# Patient Record
Sex: Male | Born: 1965 | Smoking: Never smoker
Health system: Southern US, Community
[De-identification: ages and names within clinical notes are randomized; demographics above are authoritative.]

## PROBLEM LIST (undated history)

## (undated) DIAGNOSIS — R7989 Other specified abnormal findings of blood chemistry: Secondary | ICD-10-CM

## (undated) DIAGNOSIS — G8929 Other chronic pain: Secondary | ICD-10-CM

---

## 2014-05-03 ENCOUNTER — Emergency Department: Payer: Self-pay | Admitting: Emergency Medicine

## 2014-05-05 ENCOUNTER — Emergency Department: Payer: Self-pay | Admitting: Internal Medicine

## 2014-05-11 ENCOUNTER — Emergency Department: Payer: Self-pay | Admitting: Emergency Medicine

## 2015-04-21 ENCOUNTER — Other Ambulatory Visit: Payer: Self-pay | Admitting: Internal Medicine

## 2015-04-21 DIAGNOSIS — R519 Headache, unspecified: Secondary | ICD-10-CM

## 2015-04-21 DIAGNOSIS — R51 Headache: Principal | ICD-10-CM

## 2015-05-02 ENCOUNTER — Ambulatory Visit: Admission: RE | Admit: 2015-05-02 | Payer: Self-pay | Source: Ambulatory Visit

## 2016-07-04 IMAGING — CT CT HEAD WITHOUT CONTRAST
3 of 5 series · 12 of 47 positions shown, 14 images · non-contrast
Comparison: None.

CLINICAL DATA: Unsteady gait. Patient fell at work prior to
arrival, striking head on the floor. Head and neck pain. Loss of
consciousness.

EXAM:
CT HEAD WITHOUT CONTRAST
CT CERVICAL SPINE WITHOUT CONTRAST
TECHNIQUE: Multidetector CT imaging of the head and cervical spine was
performed following the standard protocol without intravenous
contrast. Multiplanar CT image reconstructions of the cervical spine
were also generated.

[Series 6: sag bone · sagittal · 0.26mm/px · 5 of 41 slices shown, 6 images]
[im 7/41  bone]
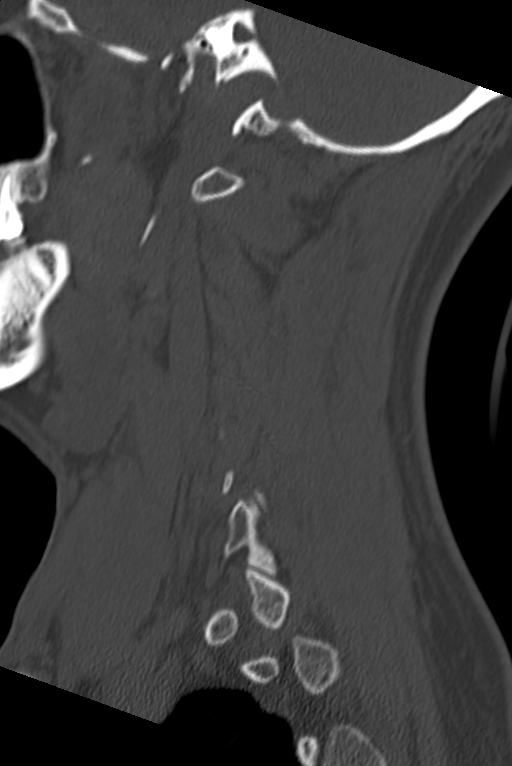
[im 14/41  bone]
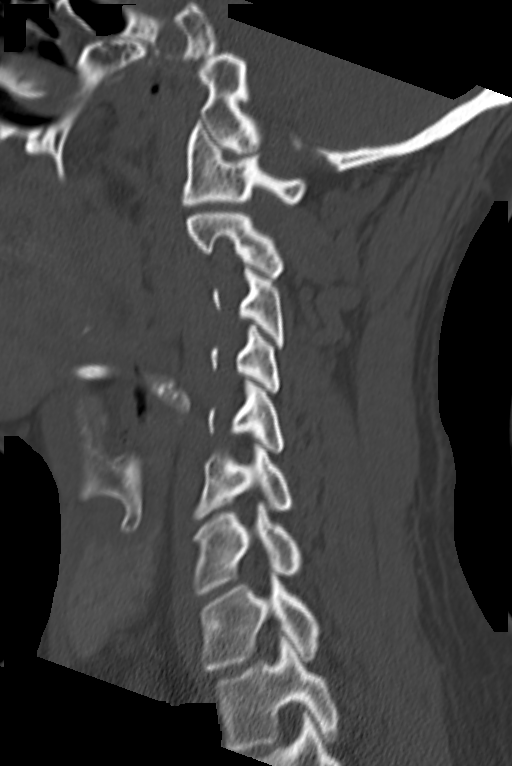
[im 21/41  brain]
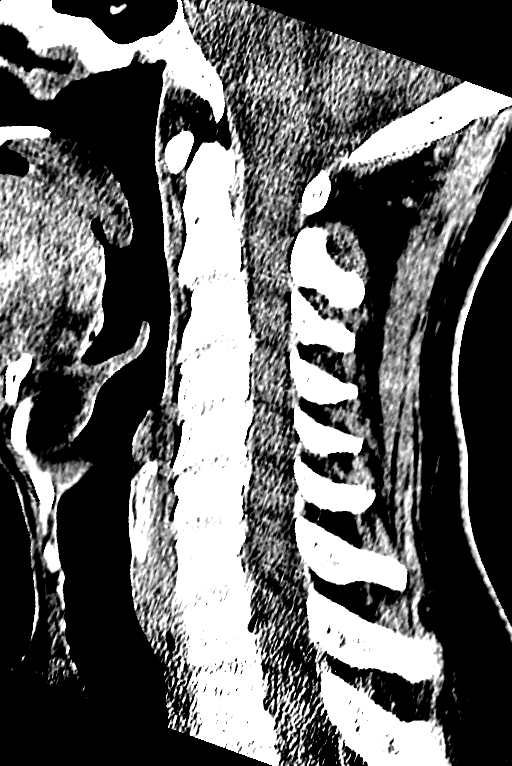
[im 21/41  bone]
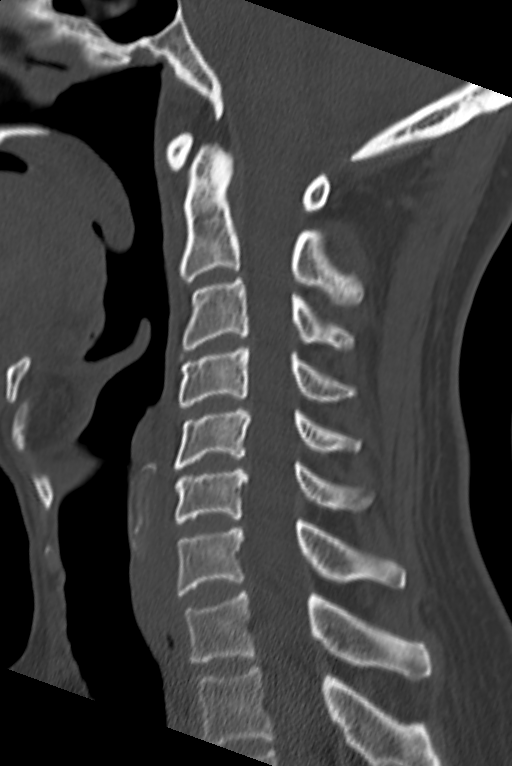
[im 27/41  bone]
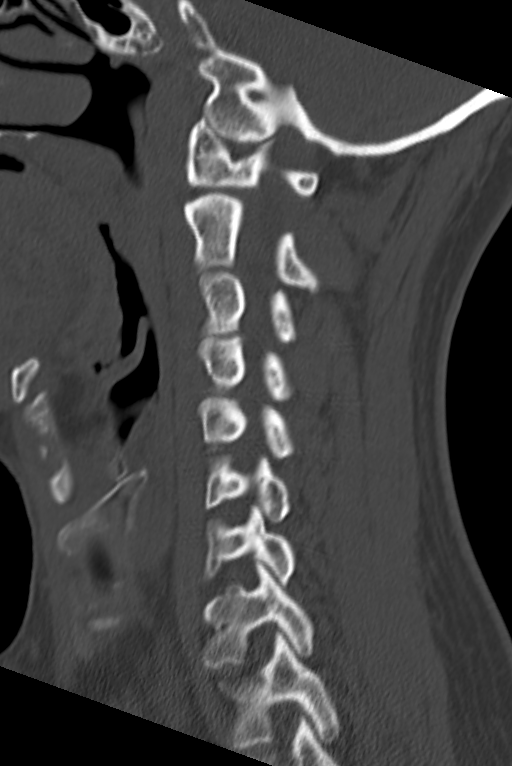
[im 34/41  bone]
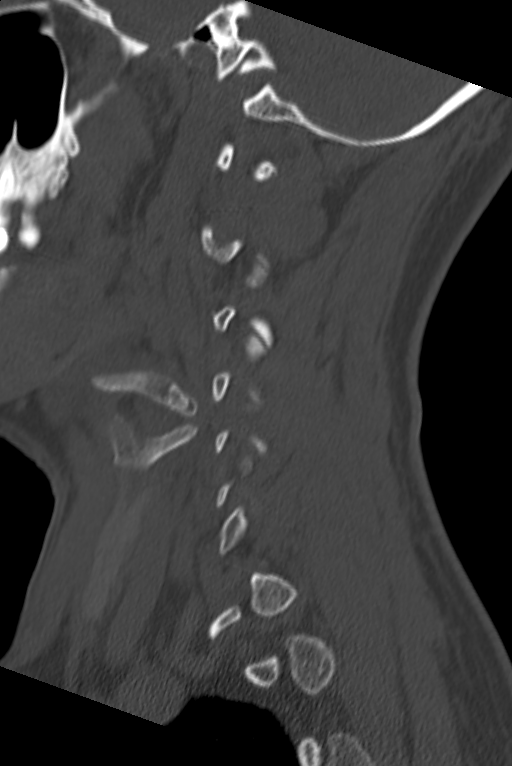

[Series 7: cor bone · coronal · 0.27mm/px · 3 of 42 slices shown]
[im 14/42  bone]
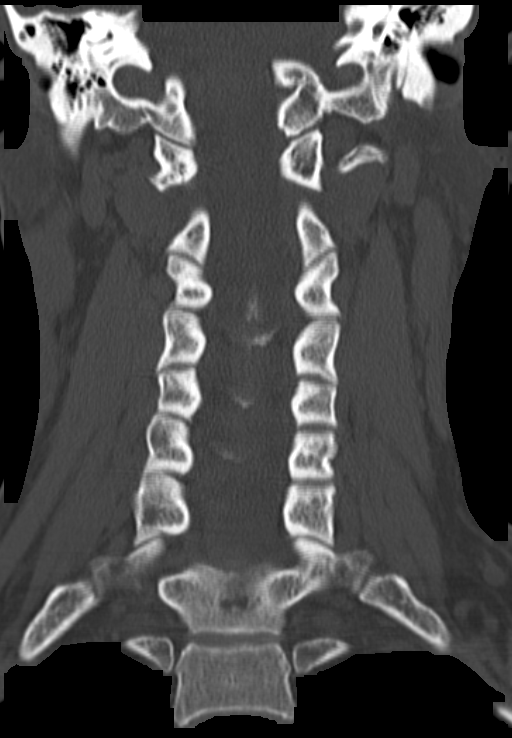
[im 19/42  bone]
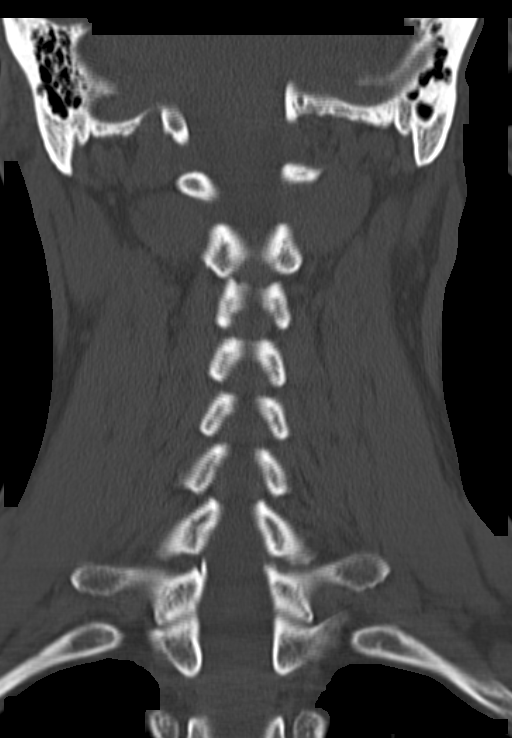
[im 23/42  bone]
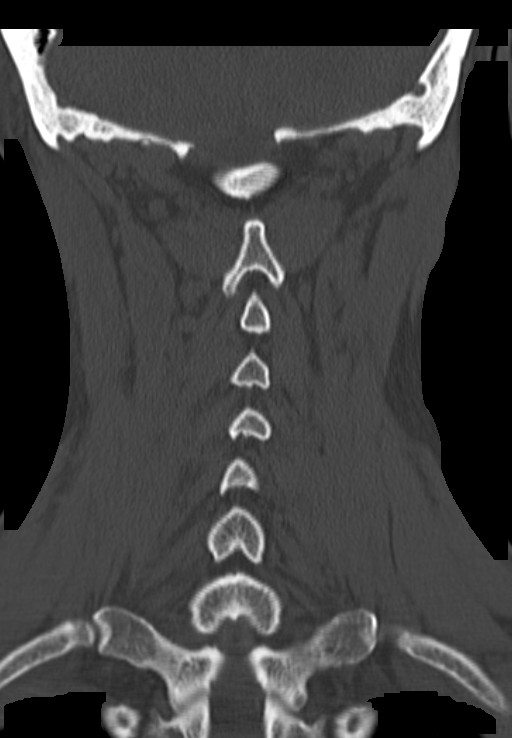

[Series 8: orthogonal axials · axial · 0.29mm/px · z∈[-294,-178]mm · 4 of 94 slices shown, 5 images]
[im 16/94  brain]
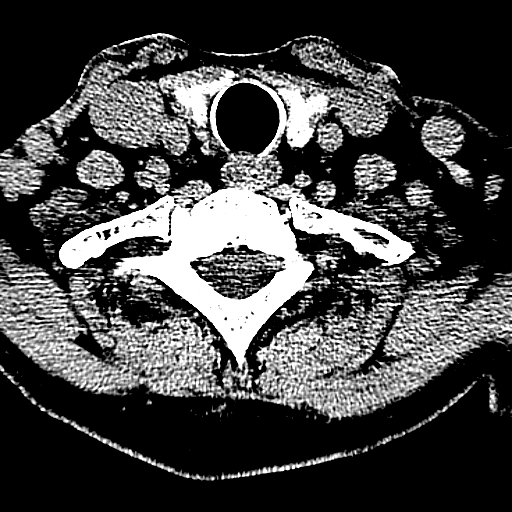
[im 16/94  bone]
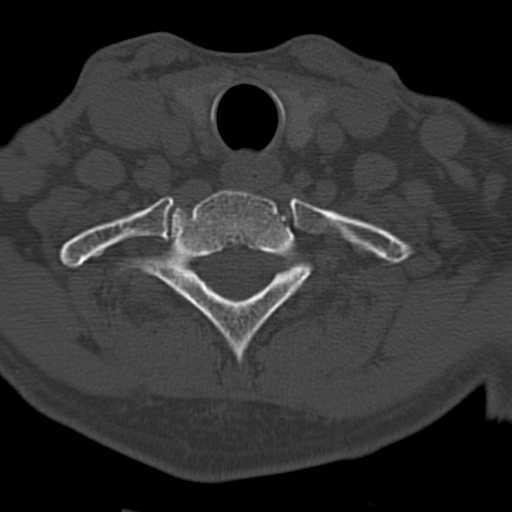
[im 39/94  bone]
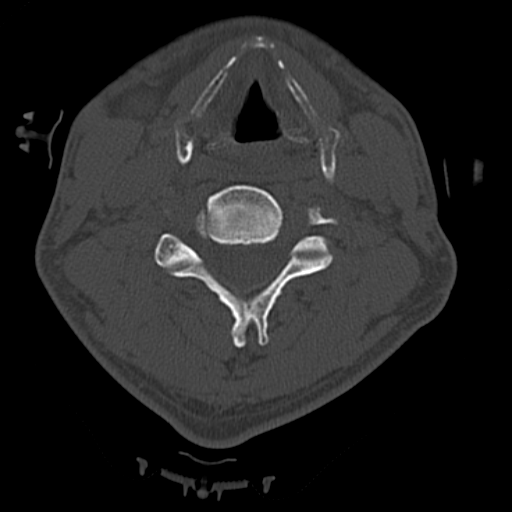
[im 55/94  bone]
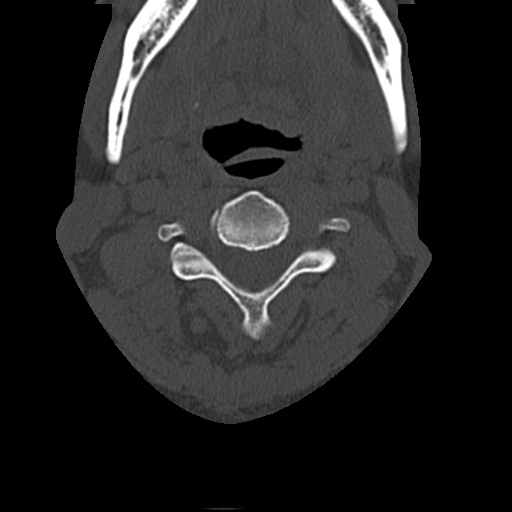
[im 78/94  bone]
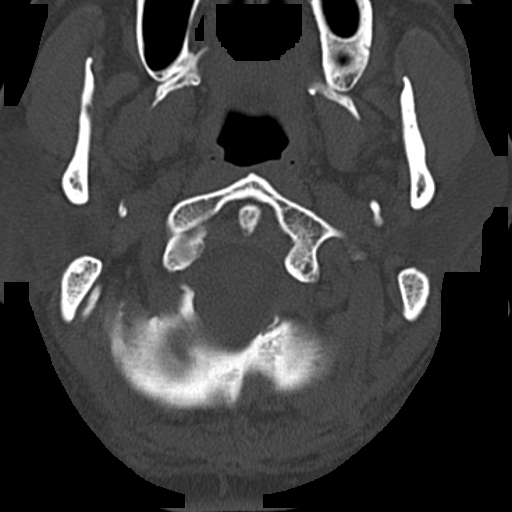

[12 of 47 positions shown; findings below may reference images not displayed]

FINDINGS: CT HEAD FINDINGS

Subcutaneous scalp hematoma and hemorrhage along the right posterior
parietal region. Ventricles and sulci appear symmetrical. No mass
effect or midline shift. No abnormal extra-axial fluid collections.
Gray-white matter junctions are distinct. Basal cisterns are not
effaced. No evidence of acute intracranial hemorrhage. No depressed
skull fractures. Mild mucosal thickening in the paranasal sinuses.
Mastoid air cells are not opacified.

CT CERVICAL SPINE FINDINGS

Reversal of the usual cervical lordosis. This may be due to patient
positioning but ligamentous injury or muscle spasm are not excluded.
No anterior subluxation. Normal alignment of facet joints. C1-2
articulation appears intact. No vertebral compression deformities.
Mild degenerative changes with mild disc space narrowing and
hypertrophic changes at the endplates predominantly at C3-4, C4-5,
and C5-6 levels. No prevertebral soft tissue swelling. No focal bone
lesion or bone destruction. Bone cortex and trabecular architecture
appears intact. Soft tissues are unremarkable.
IMPRESSION: No acute intracranial abnormalities.

Nonspecific reversal of the usual cervical lordosis. Mild
degenerative changes. No displaced fractures identified.

## 2018-10-05 ENCOUNTER — Ambulatory Visit: Payer: Self-pay | Admitting: Physician Assistant

## 2020-11-13 ENCOUNTER — Encounter: Admission: RE | Payer: Self-pay | Source: Home / Self Care

## 2020-11-13 ENCOUNTER — Encounter: Payer: Self-pay | Admitting: Anesthesiology

## 2020-11-13 ENCOUNTER — Ambulatory Visit: Admission: RE | Admit: 2020-11-13 | Payer: 59 | Source: Home / Self Care

## 2020-11-13 HISTORY — DX: Other chronic pain: G89.29

## 2020-11-13 HISTORY — DX: Other specified abnormal findings of blood chemistry: R79.89

## 2020-11-13 SURGERY — COLONOSCOPY WITH PROPOFOL
Anesthesia: General

## 2021-03-08 ENCOUNTER — Encounter: Payer: Self-pay | Admitting: Emergency Medicine

## 2021-03-09 ENCOUNTER — Encounter: Admission: RE | Payer: Self-pay | Source: Home / Self Care

## 2021-03-09 ENCOUNTER — Ambulatory Visit: Admission: RE | Admit: 2021-03-09 | Payer: 59 | Source: Home / Self Care

## 2021-03-09 SURGERY — COLONOSCOPY WITH PROPOFOL
Anesthesia: General

## 2021-10-19 ENCOUNTER — Other Ambulatory Visit (INDEPENDENT_AMBULATORY_CARE_PROVIDER_SITE_OTHER): Payer: Self-pay | Admitting: Nurse Practitioner

## 2021-10-19 DIAGNOSIS — I83812 Varicose veins of left lower extremities with pain: Secondary | ICD-10-CM

## 2021-10-22 ENCOUNTER — Encounter (INDEPENDENT_AMBULATORY_CARE_PROVIDER_SITE_OTHER): Payer: Self-pay | Admitting: Vascular Surgery

## 2021-10-22 ENCOUNTER — Ambulatory Visit (INDEPENDENT_AMBULATORY_CARE_PROVIDER_SITE_OTHER): Payer: Self-pay | Admitting: Vascular Surgery

## 2021-10-22 ENCOUNTER — Ambulatory Visit (INDEPENDENT_AMBULATORY_CARE_PROVIDER_SITE_OTHER): Payer: Self-pay

## 2021-10-22 DIAGNOSIS — I83819 Varicose veins of unspecified lower extremities with pain: Secondary | ICD-10-CM | POA: Insufficient documentation

## 2021-10-22 DIAGNOSIS — I83812 Varicose veins of left lower extremities with pain: Secondary | ICD-10-CM

## 2021-10-22 DIAGNOSIS — I872 Venous insufficiency (chronic) (peripheral): Secondary | ICD-10-CM

## 2021-10-22 NOTE — Progress Notes (Signed)
? ? ? ? ?MRN : [id number] ? ?Sean Beasley is a 56 y.o. (03-23-1966) male who presents with chief complaint of varicose veins hurt. ? ?History of Present Illness: The patient is seen for evaluation of symptomatic varicose veins. The patient relates burning and stinging which worsened steadily throughout the course of the day, particularly with standing. The patient also notes an aching and throbbing pain over the varicosities, particularly with prolonged dependent positions. The symptoms are significantly improved with elevation.  The patient also notes that during hot weather the symptoms are greatly intensified. The patient states the pain from the varicose veins interferes with work, daily exercise, shopping and household maintenance. At this point, the symptoms are persistent and severe enough that they're having a negative impact on lifestyle and are interfering with daily activities. ? ?There is no history of DVT, PE or superficial thrombophlebitis. ?There is no history of ulceration or hemorrhage. ?The patient denies a significant family history of varicose veins. ? ?The patient has not worn graduated compression in the past. At the present time the patient has not been using over-the-counter analgesics. There is no history of prior surgical intervention or sclerotherapy. ?  ?Venous duplex of the left lower extremity shows normal deep venous system, no reflux in the saphenous veins.  Isolated reflux in venous tributaries in the medial ankle. ? ?No outpatient medications have been marked as taking for the 10/22/21 encounter (Appointment) with Gilda Crease, Latina Craver, MD.  ? ? ?Past Medical History:  ?Diagnosis Date  ? Chronic left shoulder pain   ? Low vitamin D level   ? ? ?No past surgical history on file. ? ?Social History ?  ? ?Family History ?No family history on file. ? ?Not on File ? ? ?REVIEW OF SYSTEMS (Negative unless checked) ? ?Constitutional: [] Weight loss  [] Fever  [] Chills ?Cardiac: [] Chest pain   [] Chest  pressure   [] Palpitations   [] Shortness of breath when laying flat   [] Shortness of breath with exertion. ?Vascular:  [] Pain in legs with walking   [x] Pain in legs with standing  [] History of DVT   [] Phlebitis   [] Swelling in legs   [x] Varicose veins   [] Non-healing ulcers ?Pulmonary:   [] Uses home oxygen   [] Productive cough   [] Hemoptysis   [] Wheeze  [] COPD   [] Asthma ?Neurologic:  [] Dizziness   [] Seizures   [] History of stroke   [] History of TIA  [] Aphasia   [] Vissual changes   [] Weakness or numbness in arm   [] Weakness or numbness in leg ?Musculoskeletal:   [] Joint swelling   [] Joint pain   [] Low back pain ?Hematologic:  [] Easy bruising  [] Easy bleeding   [] Hypercoagulable state   [] Anemic ?Gastrointestinal:  [] Diarrhea   [] Vomiting  [] Gastroesophageal reflux/heartburn   [] Difficulty swallowing. ?Genitourinary:  [] Chronic kidney disease   [] Difficult urination  [] Frequent urination   [] Blood in urine ?Skin:  [] Rashes   [] Ulcers  ?Psychological:  [] History of anxiety   []  History of major depression. ? ?Physical Examination ? ?There were no vitals filed for this visit. ?There is no height or weight on file to calculate BMI. ?Gen: WD/WN, NAD ?Head: /AT, No temporalis wasting.  ?Ear/Nose/Throat: Hearing grossly intact, nares w/o erythema or drainage, pinna without lesions ?Eyes: PER, EOMI, sclera nonicteric.  ?Neck: Supple, no gross masses.  No JVD.  ?Pulmonary:  Good air movement, no audible wheezing, no use of accessory muscles.  ?Cardiac: RRR, precordium not hyperdynamic. ?Vascular:  Large varicosities present, greater than 6 mm left ankle.  Veins  are tender to palpation  Moderate venous stasis changes to the legs bilaterally.  Trace soft pitting edema  ?Vessel Right Left  ?Radial Palpable Palpable  ?Gastrointestinal: soft, non-distended. No guarding/no peritoneal signs.  ?Musculoskeletal: M/S 5/5 throughout.  No deformity.  ?Neurologic: CN 2-12 intact. Pain and light touch intact in extremities.  Symmetrical.   Speech is fluent. Motor exam as listed above. ?Psychiatric: Judgment intact, Mood & affect appropriate for pt's clinical situation. ?Dermatologic: Venous rashes no ulcers noted.  No changes consistent with cellulitis. ?Lymph : No lichenification or skin changes of chronic lymphedema. ? ?CBC ?No results found for: WBC, HGB, HCT, MCV, PLT ? ?BMET ?No results found for: NA, K, CL, CO2, GLUCOSE, BUN, CREATININE, CALCIUM, GFRNONAA, GFRAA ?CrCl cannot be calculated (No successful lab value found.). ? ?COAG ?No results found for: INR, PROTIME ? ?Radiology ?No results found. ? ? ?Assessment/Plan ?1. Varicose veins with pain ?Recommend: ? ?The patient is complaining of varicose veins.   ? ?I have had a long discussion with the patient regarding  varicose veins and why they cause symptoms.  Patient will begin wearing graduated compression stockings on a daily basis, beginning first thing in the morning and removing them in the evening. The patient is instructed specifically not to sleep in the stockings.   ? ?The patient  will also begin using over-the-counter analgesics such as Motrin 600 mg po TID to help control the symptoms as needed.   ? ?In addition, behavioral modification including elevation during the day will be initiated, utilizing a recliner was recommended.  The patient is also instructed to continue exercising such as walking 4-5 times per week. ? ?At this time the patient wishes to continue conservative therapy and is not interested in more invasive treatments such as laser ablation and sclerotherapy. ? ?The Patient will follow up PRN if the symptoms worsen.  ? ?2. Chronic venous insufficiency ?Recommend: ? ?The patient is complaining of varicose veins.   ? ?I have had a long discussion with the patient regarding  varicose veins and why they cause symptoms.  Patient will begin wearing graduated compression stockings on a daily basis, beginning first thing in the morning and removing them in the evening. The  patient is instructed specifically not to sleep in the stockings.   ? ?The patient  will also begin using over-the-counter analgesics such as Motrin 600 mg po TID to help control the symptoms as needed.   ? ?In addition, behavioral modification including elevation during the day will be initiated, utilizing a recliner was recommended.  The patient is also instructed to continue exercising such as walking 4-5 times per week. ? ?At this time the patient wishes to continue conservative therapy and is not interested in more invasive treatments such as laser ablation and sclerotherapy. ? ?The Patient will follow up PRN if the symptoms worsen.  ? ?3. Varicose veins of left lower extremity with pain ?Recommend: ? ?The patient is complaining of varicose veins.   ? ?I have had a long discussion with the patient regarding  varicose veins and why they cause symptoms.  Patient will begin wearing graduated compression stockings on a daily basis, beginning first thing in the morning and removing them in the evening. The patient is instructed specifically not to sleep in the stockings.   ? ?The patient  will also begin using over-the-counter analgesics such as Motrin 600 mg po TID to help control the symptoms as needed.   ? ?In addition, behavioral modification including elevation  during the day will be initiated, utilizing a recliner was recommended.  The patient is also instructed to continue exercising such as walking 4-5 times per week. ? ?At this time the patient wishes to continue conservative therapy and is not interested in more invasive treatments such as laser ablation and sclerotherapy. ? ?The Patient will follow up PRN if the symptoms worsen.  ?- VAS US LOWER EXTREMITY VENOUS REFLUX ? ? ? ?Sean DredgeGregory Harvest Stanco, MD ? ?10/22/2021 ?12:39 PM ? ?  ?

## 2021-12-25 ENCOUNTER — Encounter: Payer: Self-pay | Admitting: Ophthalmology

## 2021-12-27 NOTE — Discharge Instructions (Signed)

## 2022-01-02 ENCOUNTER — Other Ambulatory Visit: Payer: Self-pay

## 2022-01-02 ENCOUNTER — Ambulatory Visit: Payer: 59 | Admitting: Anesthesiology

## 2022-01-02 ENCOUNTER — Encounter
Admission: RE | Disposition: A | Discharge status: Home / Self Care | Payer: Self-pay | Source: Ambulatory Visit | Attending: Ophthalmology

## 2022-01-02 ENCOUNTER — Ambulatory Visit
Admission: RE | Admit: 2022-01-02 | Discharge: 2022-01-02 | Disposition: A | Discharge status: Home / Self Care | Payer: 59 | Source: Ambulatory Visit | Attending: Ophthalmology | Admitting: Ophthalmology

## 2022-01-02 ENCOUNTER — Encounter: Payer: Self-pay | Admitting: Ophthalmology

## 2022-01-02 DIAGNOSIS — H2512 Age-related nuclear cataract, left eye: Secondary | ICD-10-CM | POA: Diagnosis present

## 2022-01-02 HISTORY — PX: CATARACT EXTRACTION W/PHACO: SHX586

## 2022-01-02 SURGERY — PHACOEMULSIFICATION, CATARACT, WITH IOL INSERTION
Anesthesia: Monitor Anesthesia Care | Site: Eye | Laterality: Left

## 2022-01-02 MED ORDER — SIGHTPATH DOSE#1 NA HYALUR & NA CHOND-NA HYALUR IO KIT
PACK | INTRAOCULAR | Status: DC | PRN
  Administered 2022-01-02: 1 via OPHTHALMIC

## 2022-01-02 MED ORDER — MIDAZOLAM HCL 2 MG/2ML IJ SOLN
INTRAMUSCULAR | Status: DC | PRN
  Administered 2022-01-02: 1 mg via INTRAVENOUS

## 2022-01-02 MED ORDER — LIDOCAINE HCL (PF) 2 % IJ SOLN
INTRAOCULAR | Status: DC | PRN
  Administered 2022-01-02: 1 mL via INTRAOCULAR

## 2022-01-02 MED ORDER — SIGHTPATH DOSE#1 BSS IO SOLN: INTRAOCULAR | Status: DC | PRN

## 2022-01-02 MED ORDER — FENTANYL CITRATE (PF) 100 MCG/2ML IJ SOLN
INTRAMUSCULAR | Status: DC | PRN
  Administered 2022-01-02 (×2): 25 ug via INTRAVENOUS

## 2022-01-02 MED ORDER — TETRACAINE HCL 0.5 % OP SOLN
1.0000 [drp] | OPHTHALMIC | Status: DC | PRN
  Administered 2022-01-02 (×3): 1 [drp] via OPHTHALMIC

## 2022-01-02 MED ORDER — MOXIFLOXACIN HCL 0.5 % OP SOLN
OPHTHALMIC | Status: DC | PRN
  Administered 2022-01-02: 0.2 mL via OPHTHALMIC

## 2022-01-02 MED ORDER — ACETAMINOPHEN 325 MG PO TABS: 325.0000 mg | ORAL_TABLET | ORAL | Status: DC | PRN

## 2022-01-02 MED ORDER — SIGHTPATH DOSE#1 BSS IO SOLN
INTRAOCULAR | Status: DC | PRN
  Administered 2022-01-02: 15 mL

## 2022-01-02 MED ORDER — ARMC OPHTHALMIC DILATING DROPS
1.0000 | OPHTHALMIC | Status: DC | PRN
  Administered 2022-01-02 (×3): 1 via OPHTHALMIC

## 2022-01-02 MED ORDER — PHENYLEPHRINE-KETOROLAC 1-0.3 % IO SOLN
INTRAOCULAR | Status: DC | PRN
  Administered 2022-01-02: 69 mL via OPHTHALMIC

## 2022-01-02 MED ORDER — BRIMONIDINE TARTRATE-TIMOLOL 0.2-0.5 % OP SOLN
OPHTHALMIC | Status: DC | PRN
  Administered 2022-01-02: 1 [drp] via OPHTHALMIC

## 2022-01-02 MED ORDER — ACETAMINOPHEN 160 MG/5ML PO SOLN: 325.0000 mg | ORAL | Status: DC | PRN

## 2022-01-02 MED ORDER — ONDANSETRON HCL 4 MG/2ML IJ SOLN: 4.0000 mg | Freq: Once | INTRAMUSCULAR | Status: DC | PRN

## 2022-01-02 SURGICAL SUPPLY — 16 items
CATARACT SUITE SIGHTPATH (MISCELLANEOUS) ×2 IMPLANT
DISSECTOR HYDRO NUCLEUS 50X22 (MISCELLANEOUS) ×2 IMPLANT
DRSG TEGADERM 2-3/8X2-3/4 SM (GAUZE/BANDAGES/DRESSINGS) ×2 IMPLANT
FEE CATARACT SUITE SIGHTPATH (MISCELLANEOUS) ×1 IMPLANT
GLOVE SURG GAMMEX PI TX LF 7.5 (GLOVE) ×2 IMPLANT
GLOVE SURG SYN 8.5  E (GLOVE) ×1
GLOVE SURG SYN 8.5 E (GLOVE) ×1 IMPLANT
GLOVE SURG SYN 8.5 PF PI (GLOVE) ×1 IMPLANT
LENS IOL TECNIS EYHANCE 20.5 (Intraocular Lens) ×1 IMPLANT
NDL FILTER BLUNT 18X1 1/2 (NEEDLE) ×1 IMPLANT
NEEDLE FILTER BLUNT 18X 1/2SAF (NEEDLE) ×1
NEEDLE FILTER BLUNT 18X1 1/2 (NEEDLE) ×1 IMPLANT
SYR 3ML LL SCALE MARK (SYRINGE) ×2 IMPLANT
SYR 5ML LL (SYRINGE) ×2 IMPLANT
TIP I/A CVD TIP (MISCELLANEOUS) ×1 IMPLANT
WATER STERILE IRR 250ML POUR (IV SOLUTION) ×2 IMPLANT

## 2022-01-02 NOTE — Anesthesia Postprocedure Evaluation (Signed)
Anesthesia Post Note  Patient: Sean Beasley  Procedure(s) Performed: CATARACT EXTRACTION PHACO AND INTRAOCULAR LENS PLACEMENT (IOC) LEFT VISION BLUE (Left: Eye)     Patient location during evaluation: PACU Anesthesia Type: MAC Level of consciousness: awake Pain management: pain level controlled Vital Signs Assessment: post-procedure vital signs reviewed and stable Respiratory status: respiratory function stable Cardiovascular status: stable Postop Assessment: no apparent nausea or vomiting Anesthetic complications: no   No notable events documented.  Veda Canning

## 2022-01-02 NOTE — Op Note (Signed)
OPERATIVE NOTE  Sean Beasley [id number] 01/02/2022   PREOPERATIVE DIAGNOSIS: Nuclear sclerotic cataract left eye. H25.12   POSTOPERATIVE DIAGNOSIS: Nuclear sclerotic cataract left eye. H25.12   PROCEDURE:  Phacoemusification with posterior chamber intraocular lens placement of the left eye  Ultrasound time: Procedure(s) with comments: CATARACT EXTRACTION PHACO AND INTRAOCULAR LENS PLACEMENT (IOC) LEFT VISION BLUE (Left) - 3.93 00:34.0  LENS:   Implant Name Type Inv. Item Serial No. Manufacturer Lot No. LRB No. Used Action  LENS IOL TECNIS EYHANCE 20.5 - [id number] Intraocular Lens LENS IOL TECNIS EYHANCE 20.5 [id number] [employer]  Left 1 Implanted      SURGEON:  Julious Payer. Rolley Sims, MD   ANESTHESIA:  Topical with tetracaine drops, augmented with 1% preservative-free intracameral lidocaine.   COMPLICATIONS:  None.   DESCRIPTION OF PROCEDURE:  The patient was identified in the holding room and transported to the operating room and placed in the supine position under the operating microscope.  The left eye was identified as the operative eye, which was prepped and draped in the usual sterile ophthalmic fashion.   A 1 millimeter clear-corneal paracentesis was made inferotemporally. Preservative-free 1% lidocaine mixed with 1:1,000 bisulfite-free aqueous solution of epinephrine was injected into the anterior chamber. The anterior chamber was then filled with Viscoat viscoelastic. A 2.4 millimeter keratome was used to make a clear-corneal incision superotemporally. A curvilinear capsulorrhexis was made with a cystotome and capsulorrhexis forceps. Balanced salt solution was used to hydrodissect and hydrodelineate the nucleus. Phacoemulsification was then used to remove the lens nucleus and epinucleus. The remaining cortex was then removed using the irrigation and aspiration handpiece. Provisc was then placed into the capsular bag to distend it for lens placement. A +20.50 DIB00 intraocular lens was  then injected into the capsular bag. The remaining viscoelastic was aspirated.   Wounds were hydrated with balanced salt solution.  The anterior chamber was inflated to a physiologic pressure with balanced salt solution.  No wound leaks were noted. Vigamox was injected intracamerally.  Timolol and Brimonidine drops were applied to the eye.  The patient was taken to the recovery room in stable condition without complications of anesthesia or surgery.  [name] 01/02/2022, 9:35 AM

## 2022-01-02 NOTE — Transfer of Care (Signed)
Immediate Anesthesia Transfer of Care Note  Patient: Sean Beasley  Procedure(s) Performed: CATARACT EXTRACTION PHACO AND INTRAOCULAR LENS PLACEMENT (IOC) LEFT VISION BLUE (Left: Eye)  Patient Location: PACU  Anesthesia Type: MAC  Level of Consciousness: awake, alert  and patient cooperative  Airway and Oxygen Therapy: Patient Spontanous Breathing and Patient connected to supplemental oxygen  Post-op Assessment: Post-op Vital signs reviewed, Patient's Cardiovascular Status Stable, Respiratory Function Stable, Patent Airway and No signs of Nausea or vomiting  Post-op Vital Signs: Reviewed and stable  Complications: No notable events documented.

## 2022-01-02 NOTE — H&P (Signed)
[facility]   Primary Care Physician:  Barbette Reichmann, MD Ophthalmologist: Dr. Deberah Pelton  Pre-Procedure History & Physical: HPI:  Sean Beasley is a 56 y.o. male here for cataract surgery.   Past Medical History:  Diagnosis Date   Chronic left shoulder pain    Low vitamin D level     History reviewed. No pertinent surgical history.  Prior to Admission medications   Medication Sig Start Date End Date Taking? Authorizing Provider  tamsulosin (FLOMAX) 0.4 MG CAPS capsule Take 0.4 mg by mouth.   Yes [provider]  meloxicam (MOBIC) 7.5 MG tablet Take 7.5 mg by mouth daily as needed for pain. Patient not taking: Reported on 10/22/2021    [provider]  Vitamin D, Ergocalciferol, (DRISDOL) 1.25 MG (50000 UNIT) CAPS capsule Take 50,000 Units by mouth every 7 (seven) days. Patient not taking: Reported on 12/25/2021    [provider]    Allergies as of 12/19/2021   (No Known Allergies)    Family History  Problem Relation Age of Onset   Diabetes Mother     Social History   Socioeconomic History   Marital status: Unknown    Spouse name: Not on file   Number of children: Not on file   Years of education: Not on file   Highest education level: Not on file  Occupational History   Not on file  Tobacco Use   Smoking status: Never   Smokeless tobacco: Never  Vaping Use   Vaping Use: Never used  Substance and Sexual Activity   Alcohol use: Never   Drug use: Not on file   Sexual activity: Not on file  Other Topics Concern   Not on file  Social History Narrative   Not on file   Social Determinants of Health   Financial Resource Strain: Not on file  Food Insecurity: Not on file  Transportation Needs: Not on file  Physical Activity: Not on file  Stress: Not on file  Social Connections: Not on file  Intimate Partner Violence: Not on file    Review of Systems: See HPI, otherwise negative ROS  Physical Exam: BP 110/83   Pulse  63   Temp (!) 97.3 F (36.3 C) (Temporal)   Resp 17   Ht 5\' 5"  (1.651 m)   Wt 64 kg   SpO2 99%   BMI 23.46 kg/m  General:   Alert, cooperative in NAD Head:  Normocephalic and atraumatic. Respiratory:  Normal work of breathing. Cardiovascular:  RRR  Impression/Plan: Sean Beasley is here for cataract surgery.  Risks, benefits, limitations, and alternatives regarding cataract surgery have been reviewed with the patient.  Questions have been answered.  All parties agreeable.   Conley Rolls, MD  01/02/2022, 8:14 AM

## 2022-01-02 NOTE — Anesthesia Procedure Notes (Signed)
Procedure Name: MAC Date/Time: 01/02/2022 9:07 AM  Performed by: Dionne Bucy, CRNAPre-anesthesia Checklist: Patient identified, Emergency Drugs available, Suction available, Patient being monitored and Timeout performed Patient Re-evaluated:Patient Re-evaluated prior to induction Oxygen Delivery Method: Nasal cannula Placement Confirmation: positive ETCO2

## 2022-01-02 NOTE — Anesthesia Preprocedure Evaluation (Signed)
Anesthesia Evaluation  Patient identified by MRN, date of birth, ID band Patient awake    Reviewed: Allergy & Precautions, NPO status   Airway Mallampati: II  TM Distance: >3 FB     Dental   Pulmonary neg pulmonary ROS,    Pulmonary exam normal        Cardiovascular negative cardio ROS   Rhythm:Regular Rate:Normal     Neuro/Psych    GI/Hepatic negative GI ROS,   Endo/Other    Renal/GU      Musculoskeletal   Abdominal   Peds  Hematology   Anesthesia Other Findings   Reproductive/Obstetrics                             Anesthesia Physical Anesthesia Plan  ASA: 1  Anesthesia Plan: MAC   Post-op Pain Management: Minimal or no pain anticipated   Induction: Intravenous  PONV Risk Score and Plan: TIVA, Midazolam and Treatment may vary due to age or medical condition  Airway Management Planned: Natural Airway and Nasal Cannula  Additional Equipment:   Intra-op Plan:   Post-operative Plan:   Informed Consent: I have reviewed the patients History and Physical, chart, labs and discussed the procedure including the risks, benefits and alternatives for the proposed anesthesia with the patient or authorized representative who has indicated his/her understanding and acceptance.       Plan Discussed with: CRNA  Anesthesia Plan Comments:         Anesthesia Quick Evaluation

## 2022-01-03 ENCOUNTER — Encounter: Payer: Self-pay | Admitting: Ophthalmology

## 2022-01-24 ENCOUNTER — Encounter (INDEPENDENT_AMBULATORY_CARE_PROVIDER_SITE_OTHER): Payer: Self-pay | Admitting: *Deleted

## 2022-02-01 DIAGNOSIS — Z961 Presence of intraocular lens: Secondary | ICD-10-CM | POA: Diagnosis not present

## 2022-02-28 DIAGNOSIS — Z1211 Encounter for screening for malignant neoplasm of colon: Secondary | ICD-10-CM | POA: Diagnosis not present

## 2022-05-06 ENCOUNTER — Ambulatory Visit: Payer: 59 | Admitting: Anesthesiology

## 2022-05-06 ENCOUNTER — Encounter
Admission: RE | Disposition: A | Discharge status: Home / Self Care | Payer: Self-pay | Source: Ambulatory Visit | Attending: Gastroenterology

## 2022-05-06 ENCOUNTER — Other Ambulatory Visit: Payer: Self-pay

## 2022-05-06 ENCOUNTER — Encounter: Payer: Self-pay | Admitting: *Deleted

## 2022-05-06 ENCOUNTER — Ambulatory Visit
Admission: RE | Admit: 2022-05-06 | Discharge: 2022-05-06 | Disposition: A | Discharge status: Home / Self Care | Payer: 59 | Source: Ambulatory Visit | Attending: Gastroenterology | Admitting: Gastroenterology

## 2022-05-06 DIAGNOSIS — Z1211 Encounter for screening for malignant neoplasm of colon: Secondary | ICD-10-CM | POA: Diagnosis not present

## 2022-05-06 DIAGNOSIS — K649 Unspecified hemorrhoids: Secondary | ICD-10-CM | POA: Diagnosis not present

## 2022-05-06 DIAGNOSIS — K640 First degree hemorrhoids: Secondary | ICD-10-CM | POA: Insufficient documentation

## 2022-05-06 DIAGNOSIS — K635 Polyp of colon: Secondary | ICD-10-CM | POA: Diagnosis not present

## 2022-05-06 DIAGNOSIS — D123 Benign neoplasm of transverse colon: Secondary | ICD-10-CM | POA: Diagnosis not present

## 2022-05-06 HISTORY — PX: COLONOSCOPY WITH PROPOFOL: SHX5780

## 2022-05-06 SURGERY — COLONOSCOPY WITH PROPOFOL
Anesthesia: General

## 2022-05-06 MED ORDER — LIDOCAINE HCL (CARDIAC) PF 100 MG/5ML IV SOSY
PREFILLED_SYRINGE | INTRAVENOUS | Status: DC | PRN
  Administered 2022-05-06: 50 mg via INTRAVENOUS

## 2022-05-06 MED ORDER — SODIUM CHLORIDE 0.9 % IV SOLN
INTRAVENOUS | Status: DC
Start: 2022-05-06 — End: 2022-05-06

## 2022-05-06 MED ORDER — PHENYLEPHRINE HCL (PRESSORS) 10 MG/ML IV SOLN
INTRAVENOUS | Status: DC | PRN
  Administered 2022-05-06: 160 ug via INTRAVENOUS

## 2022-05-06 MED ORDER — GLYCOPYRROLATE 0.2 MG/ML IJ SOLN
INTRAMUSCULAR | Status: DC | PRN
  Administered 2022-05-06: .2 mg via INTRAVENOUS

## 2022-05-06 MED ORDER — PROPOFOL 500 MG/50ML IV EMUL
INTRAVENOUS | Status: DC | PRN
  Administered 2022-05-06: 150 ug/kg/min via INTRAVENOUS

## 2022-05-06 MED ORDER — PROPOFOL 10 MG/ML IV BOLUS
INTRAVENOUS | Status: DC | PRN
  Administered 2022-05-06: 60 mg via INTRAVENOUS

## 2022-05-06 MED ORDER — DEXMEDETOMIDINE HCL IN NACL 80 MCG/20ML IV SOLN
INTRAVENOUS | Status: DC | PRN
  Administered 2022-05-06: 12 ug via BUCCAL

## 2022-05-06 MED ORDER — STERILE WATER FOR IRRIGATION IR SOLN
Status: DC | PRN
  Administered 2022-05-06: 60 mL

## 2022-05-06 NOTE — Transfer of Care (Signed)
Immediate Anesthesia Transfer of Care Note  Patient: Sean Beasley  Procedure(s) Performed: COLONOSCOPY WITH PROPOFOL  Patient Location: PACU and Endoscopy Unit  Anesthesia Type:General  Level of Consciousness: drowsy  Airway & Oxygen Therapy: Patient Spontanous Breathing  Post-op Assessment: Report given to RN and Post -op Vital signs reviewed and stable  Post vital signs: Reviewed and stable  Last Vitals:  Vitals Value Taken Time  BP 99/66 05/06/22 1449  Temp 36.1 C 05/06/22 1449  Pulse 64 05/06/22 1451  Resp 15 05/06/22 1451  SpO2 100 % 05/06/22 1451  Vitals shown include unvalidated device data.  Last Pain:  Vitals:   05/06/22 1449  TempSrc: Temporal  PainSc: Asleep         Complications: No notable events documented.

## 2022-05-06 NOTE — H&P (Signed)
Outpatient short stay form Pre-procedure 05/06/2022  Sean Rubenstein, MD  Primary Physician: Tracie Harrier, MD  Reason for visit:  Screening  History of present illness:    56 y/o gentleman with history of vitamin D deficiency here for index screening colonoscopy. No blood thinners. No family history of GI malignancies. No abdominal surgeries.    Current Facility-Administered Medications:    0.9 %  sodium chloride infusion, , Intravenous, Continuous, Fermon Ureta, Hilton Cork, MD  Medications Prior to Admission  Medication Sig Dispense Refill Last Dose   tamsulosin (FLOMAX) 0.4 MG CAPS capsule Take 0.4 mg by mouth.   05/05/2022   meloxicam (MOBIC) 7.5 MG tablet Take 7.5 mg by mouth daily as needed for pain. (Patient not taking: Reported on 10/22/2021)   Not Taking   Vitamin D, Ergocalciferol, (DRISDOL) 1.25 MG (50000 UNIT) CAPS capsule Take 50,000 Units by mouth every 7 (seven) days. (Patient not taking: Reported on 12/25/2021)   Not Taking     No Known Allergies   Past Medical History:  Diagnosis Date   Chronic left shoulder pain    Low vitamin D level     Review of systems:  Otherwise negative.    Physical Exam  Gen: Alert, oriented. Appears stated age.  HEENT: PERRLA. Lungs: No respiratory distress CV: RRR Abd: soft, benign, no masses Ext: No edema    Planned procedures: Proceed with colonoscopy. The patient understands the nature of the planned procedure, indications, risks, alternatives and potential complications including but not limited to bleeding, infection, perforation, damage to internal organs and possible oversedation/side effects from anesthesia. The patient agrees and gives consent to proceed.  Please refer to procedure notes for findings, recommendations and patient disposition/instructions.     Sean Rubenstein, MD [facility]

## 2022-05-06 NOTE — Anesthesia Preprocedure Evaluation (Signed)
Anesthesia Evaluation  Patient identified by MRN, date of birth, ID band Patient awake    Reviewed: Allergy & Precautions, NPO status , Patient's Chart, lab work & pertinent test results  History of Anesthesia Complications Negative for: history of anesthetic complications  Airway Mallampati: II  TM Distance: >3 FB Neck ROM: Full    Dental  (+) Poor Dentition, Missing   Pulmonary neg pulmonary ROS, neg sleep apnea, neg COPD, Patient abstained from smoking.Not current smoker   Pulmonary exam normal breath sounds clear to auscultation       Cardiovascular Exercise Tolerance: Good METS(-) hypertension(-) CAD and (-) Past MI negative cardio ROS (-) dysrhythmias  Rhythm:Regular Rate:Normal - Systolic murmurs    Neuro/Psych negative neurological ROS  negative psych ROS   GI/Hepatic ,neg GERD  ,,(+)     (-) substance abuse    Endo/Other  neg diabetes    Renal/GU negative Renal ROS     Musculoskeletal   Abdominal   Peds  Hematology   Anesthesia Other Findings Past Medical History: No date: Chronic left shoulder pain No date: Low vitamin D level  Reproductive/Obstetrics                             Anesthesia Physical Anesthesia Plan  ASA: 1  Anesthesia Plan: General   Post-op Pain Management: Minimal or no pain anticipated   Induction: Intravenous  PONV Risk Score and Plan: 2 and Propofol infusion, TIVA and Ondansetron  Airway Management Planned: Nasal Cannula  Additional Equipment: None  Intra-op Plan:   Post-operative Plan:   Informed Consent: I have reviewed the patients History and Physical, chart, labs and discussed the procedure including the risks, benefits and alternatives for the proposed anesthesia with the patient or authorized representative who has indicated his/her understanding and acceptance.     Dental advisory given and Interpreter used for interveiw  [employer] video interpreter # [id number])  Plan Discussed with: CRNA and Surgeon  Anesthesia Plan Comments: (Discussed risks of anesthesia with patient, including possibility of difficulty with spontaneous ventilation under anesthesia necessitating airway intervention, PONV, and rare risks such as cardiac or respiratory or neurological events, and allergic reactions. Discussed the role of CRNA in patient's perioperative care. Patient understands.)       Anesthesia Quick Evaluation

## 2022-05-06 NOTE — Interval H&P Note (Signed)
History and Physical Interval Note:  05/06/2022 2:19 PM  Sean Beasley  has presented today for surgery, with the diagnosis of Colon Cancer Screening.  The various methods of treatment have been discussed with the patient and family. After consideration of risks, benefits and other options for treatment, the patient has consented to  Procedure(s) with comments: COLONOSCOPY WITH PROPOFOL (N/A) - [place] INTERPRETATION as a surgical intervention.  The patient's history has been reviewed, patient examined, no change in status, stable for surgery.  I have reviewed the patient's chart and labs.  Questions were answered to the patient's satisfaction.     Lesly Rubenstein  Ok to proceed with colonoscopy

## 2022-05-06 NOTE — Op Note (Signed)
[facility] Patient Name: Sean Beasley Procedure Date: 05/06/2022 2:20 PM MRN: [id number] Account #: [id number] Date of Birth: January 13, 1966 Admit Type: Outpatient Age: 56 Room: [facility] ENDO ROOM 3 Gender: Male Note Status: Finalized Instrument Name: Peds Colonoscope [id number] Procedure:             Colonoscopy Indications:           Screening for colorectal malignant neoplasm Providers:             Andrey Farmer MD, MD Medicines:             Monitored Anesthesia Care Complications:         No immediate complications. Estimated blood loss:                         Minimal. Procedure:             Pre-Anesthesia Assessment:                        - Prior to the procedure, a History and Physical was                         performed, and patient medications and allergies were                         reviewed. The patient is competent. The risks and                         benefits of the procedure and the sedation options and                         risks were discussed with the patient. All questions                         were answered and informed consent was obtained.                         Patient identification and proposed procedure were                         verified by the physician, the nurse, the                         anesthesiologist, the anesthetist and the technician                         in the endoscopy suite. Mental Status Examination:                         alert and oriented. Airway Examination: normal                         oropharyngeal airway and neck mobility. Respiratory                         Examination: clear to auscultation. CV Examination:                         normal. Prophylactic Antibiotics: The patient does not  require prophylactic antibiotics. Prior                         Anticoagulants: The patient has taken no anticoagulant                         or antiplatelet agents. ASA Grade  Assessment: I - A                         normal, healthy patient. After reviewing the risks and                         benefits, the patient was deemed in satisfactory                         condition to undergo the procedure. The anesthesia                         plan was to use monitored anesthesia care (MAC).                         Immediately prior to administration of medications,                         the patient was re-assessed for adequacy to receive                         sedatives. The heart rate, respiratory rate, oxygen                         saturations, blood pressure, adequacy of pulmonary                         ventilation, and response to care were monitored                         throughout the procedure. The physical status of the                         patient was re-assessed after the procedure.                        After obtaining informed consent, the colonoscope was                         passed under direct vision. Throughout the procedure,                         the patient's blood pressure, pulse, and oxygen                         saturations were monitored continuously. The                         Colonoscope was introduced through the anus and                         advanced to the the cecum, identified by appendiceal  orifice and ileocecal valve. The colonoscopy was                         performed without difficulty. The patient tolerated                         the procedure well. The quality of the bowel                         preparation was good. The ileocecal valve, appendiceal                         orifice, and rectum were photographed. Findings:      The perianal and digital rectal examinations were normal.      A 1 mm polyp was found in the transverse colon. The polyp was sessile.       The polyp was removed with a jumbo cold forceps. Resection and retrieval       were complete. Estimated blood loss was  minimal.      Internal hemorrhoids were found during retroflexion. The hemorrhoids       were Grade I (internal hemorrhoids that do not prolapse).      The exam was otherwise without abnormality on direct and retroflexion       views. Impression:            - One 1 mm polyp in the transverse colon, removed with                         a jumbo cold forceps. Resected and retrieved.                        - Internal hemorrhoids.                        - The examination was otherwise normal on direct and                         retroflexion views. Recommendation:        - Discharge patient to home.                        - Resume previous diet.                        - Continue present medications.                        - Await pathology results.                        - Repeat colonoscopy for surveillance based on                         pathology results.                        - Return to referring physician as previously                         scheduled. Procedure Code(s):     --- Professional ---  45380, Colonoscopy, flexible; with biopsy, single or                         multiple Diagnosis Code(s):     --- Professional ---                        Z12.11, Encounter for screening for malignant neoplasm                         of colon                        D12.3, Benign neoplasm of transverse colon (hepatic                         flexure or splenic flexure)                        K64.0, First degree hemorrhoids CPT copyright 2022 [employer]. All rights reserved. The codes documented in this report are preliminary and upon coder review may  be revised to meet current compliance requirements. Andrey Farmer MD, MD 05/06/2022 2:52:03 PM Number of Addenda: 0 Note Initiated On: 05/06/2022 2:20 PM Scope Withdrawal Time: 0 hours 9 minutes 54 seconds  Total Procedure Duration: 0 hours 14 minutes 58 seconds  Estimated Blood Loss:  Estimated blood  loss was minimal.      [facility]

## 2022-05-07 ENCOUNTER — Encounter: Payer: Self-pay | Admitting: Gastroenterology

## 2022-05-07 NOTE — Anesthesia Postprocedure Evaluation (Signed)
Anesthesia Post Note  Patient: Sean Beasley  Procedure(s) Performed: COLONOSCOPY WITH PROPOFOL  Patient location during evaluation: Endoscopy Anesthesia Type: General Level of consciousness: awake and alert Pain management: pain level controlled Vital Signs Assessment: post-procedure vital signs reviewed and stable Respiratory status: spontaneous breathing, nonlabored ventilation, respiratory function stable and patient connected to nasal cannula oxygen Cardiovascular status: blood pressure returned to baseline and stable Postop Assessment: no apparent nausea or vomiting Anesthetic complications: no   No notable events documented.   Last Vitals:  Vitals:   05/06/22 1459 05/06/22 1509  BP: 96/66 101/70  Pulse: 80 74  Resp: 20 17  Temp:    SpO2: 100% 100%    Last Pain:  Vitals:   05/06/22 1509  TempSrc:   PainSc: 0-No pain                 Arita Miss

## 2022-05-08 LAB — SURGICAL PATHOLOGY

## 2023-02-05 DIAGNOSIS — Z1331 Encounter for screening for depression: Secondary | ICD-10-CM | POA: Diagnosis not present

## 2023-02-05 DIAGNOSIS — M25562 Pain in left knee: Secondary | ICD-10-CM | POA: Diagnosis not present

## 2023-02-05 DIAGNOSIS — Z125 Encounter for screening for malignant neoplasm of prostate: Secondary | ICD-10-CM | POA: Diagnosis not present

## 2023-02-05 DIAGNOSIS — M25561 Pain in right knee: Secondary | ICD-10-CM | POA: Diagnosis not present

## 2023-02-05 DIAGNOSIS — R7989 Other specified abnormal findings of blood chemistry: Secondary | ICD-10-CM | POA: Diagnosis not present

## 2023-02-05 DIAGNOSIS — Z0000 Encounter for general adult medical examination without abnormal findings: Secondary | ICD-10-CM | POA: Diagnosis not present

## 2023-02-05 DIAGNOSIS — R39198 Other difficulties with micturition: Secondary | ICD-10-CM | POA: Diagnosis not present
